# Patient Record
Sex: Male | Born: 2000 | Race: White | Hispanic: No | Marital: Single | Smoking: Never smoker
Health system: Southern US, Community
[De-identification: ages and names within clinical notes are randomized; demographics above are authoritative.]

## PROBLEM LIST (undated history)

## (undated) ENCOUNTER — Emergency Department (HOSPITAL_COMMUNITY): Discharge status: Against Medical Advice | Payer: Self-pay

## (undated) DIAGNOSIS — I499 Cardiac arrhythmia, unspecified: Secondary | ICD-10-CM

## (undated) HISTORY — PX: NO PAST SURGERIES: SHX2092

---
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
## 2018-05-13 IMAGING — MR MR CARD MORPHOLOGY WO/W CM
13 of 14 series · 39 of 40 positions shown · IV contrast (multihance)
Comparison: none

CLINICAL DATA: R/O Marfan's

EXAM:
CARDIAC MRI
TECHNIQUE: The patient was scanned on a 1.5 Tesla GE magnet. A dedicated
cardiac coil was used. Functional imaging was done using Fiesta
sequences. [DATE], and 4 chamber views were done to assess for RWMA's.
Modified Rodrigo Francisco rule using a short axis stack was used to
calculate an ejection fraction on a dedicated work station using
Circle software. The patient received 20 cc of Multihance. After 10
minutes inversion recovery sequences were used to assess for
infiltration and scar tissue.
CONTRAST:  20 cc Multihance

[Series 3: bSSFP · sagittal · 8.0mm · 1.21mm/px · 1 of 18 slices shown (1 of 7)]
[im 1/18]
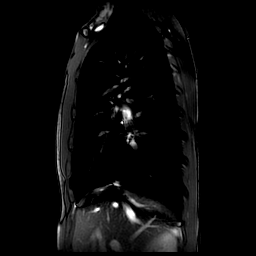

[Series 4: bSSFP · coronal · 8.0mm · 1.37mm/px · 1 of 20 slices shown (2 of 7)]
[im 1/20]
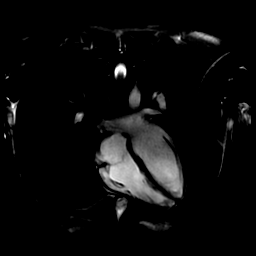

[Series 5: bSSFP · axial · 8.0mm · 1.21mm/px · 1 of 20 slices shown (3 of 7)]
[im 1/20]
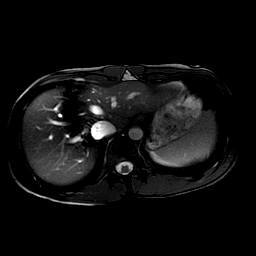

[Series 6: bSSFP · oblique · 8.0mm · 1.25mm/px · 15 of 360 slices shown (4 of 7)]
[im 1/360]
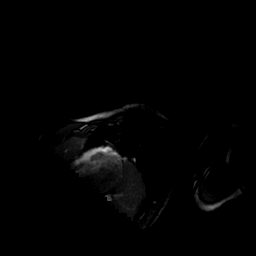
[im 26/360]
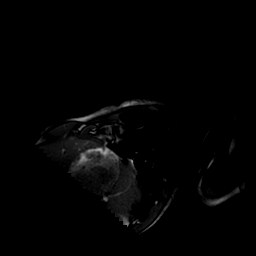
[im 52/360]
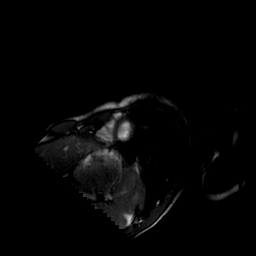
[im 77/360]
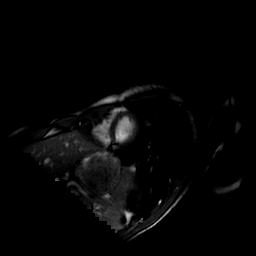
[im 103/360]
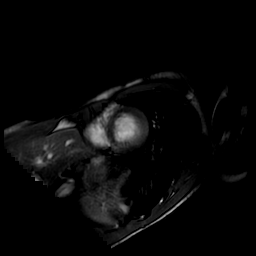
[im 129/360]
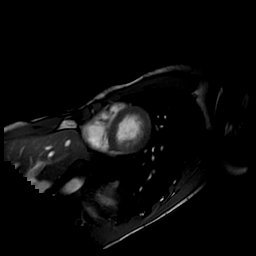
[im 154/360]
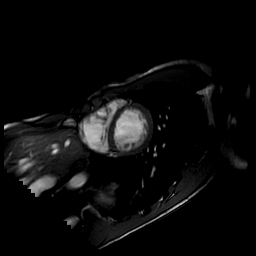
[im 180/360]
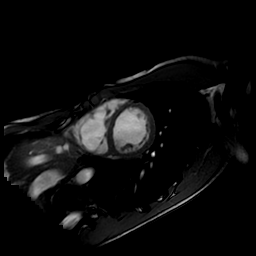
[im 206/360]
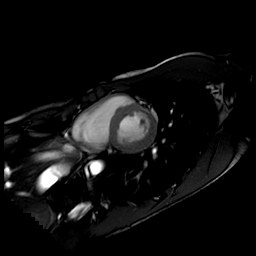
[im 231/360]
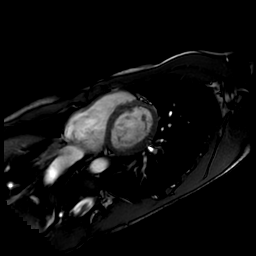
[im 257/360]
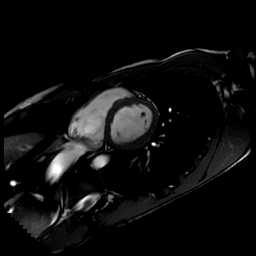
[im 283/360]
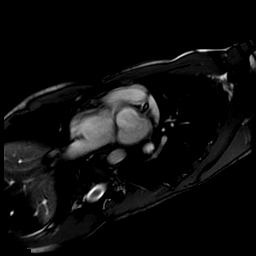
[im 308/360]
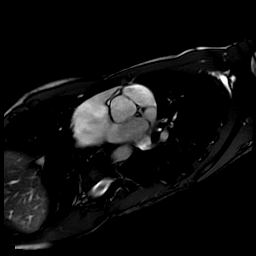
[im 334/360]
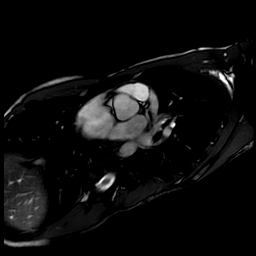
[im 360/360]
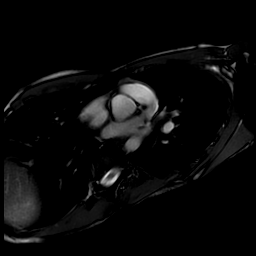

[Series 7: T1 · oblique · 6.0mm · 1.17mm/px · 1 of 3 slices shown]
[im 1/3]
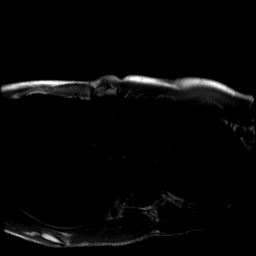

[Series 9: bSSFP · axial · 6.0mm · 1.29mm/px · z∈[-109,-74]mm · 6 of 160 slices shown (5 of 7)]
[im 1/160]
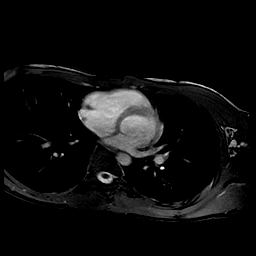
[im 32/160]
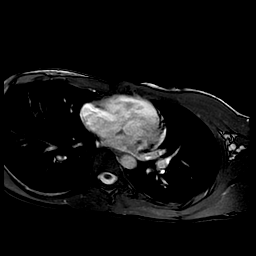
[im 64/160]
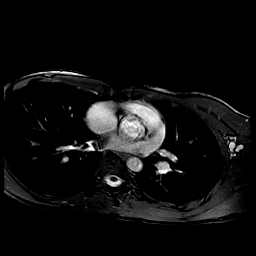
[im 96/160]
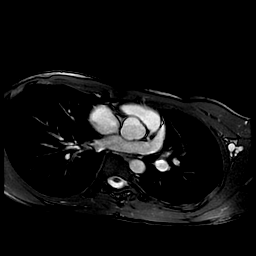
[im 128/160]
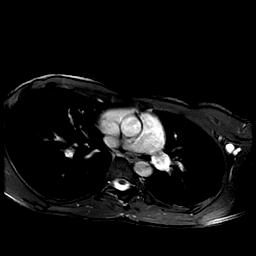
[im 160/160]
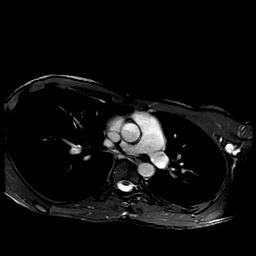

[Series 10: bSSFP · sagittal · 6.0mm · 1.29mm/px · 5 of 140 slices shown (6 of 7)]
[im 1/140]
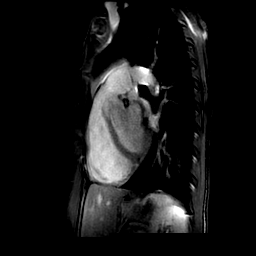
[im 35/140]
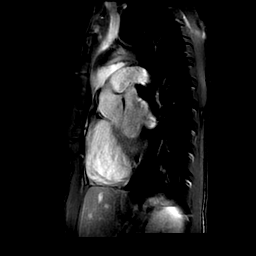
[im 70/140]
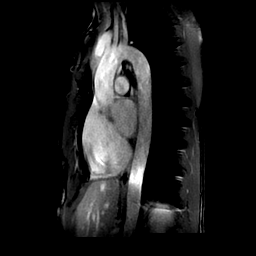
[im 105/140]
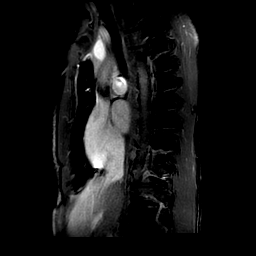
[im 140/140]
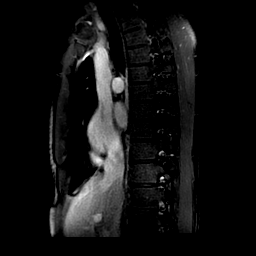

[Series 11: bSSFP · coronal · 8.0mm · 1.21mm/px · 2 of 60 slices shown (7 of 7)]
[im 1/60]
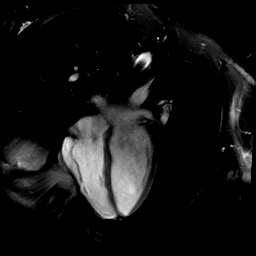
[im 60/60]
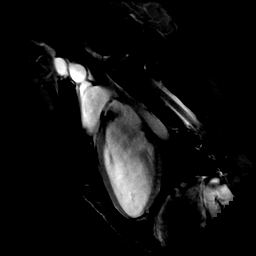

[Series 16: delayed ir prep · oblique · 8.0mm · 1.25mm/px · 1 of 15 slices shown]
[im 1/15]
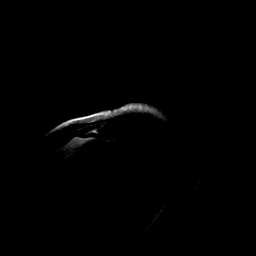

[Series 17: rad mde · coronal · 8.0mm · 1.21mm/px · 1 of 3 slices shown]
[im 1/3]
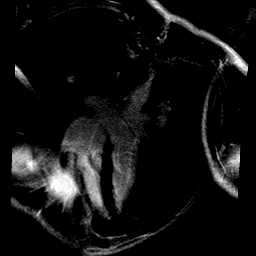

[((id)/801/1)-((id)/800/1) · sagittal · 2.0mm · 0.61mm/px · 3 of 68 slices shown]
[im 1/68]
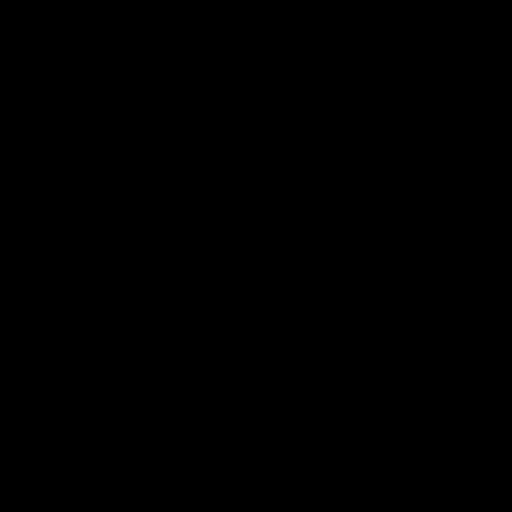
[im 34/68]
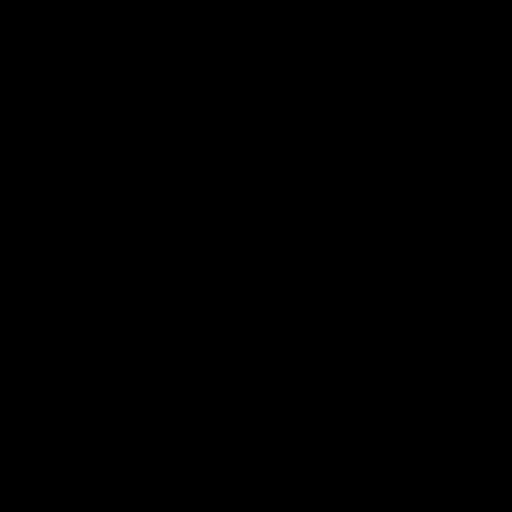
[im 68/68]
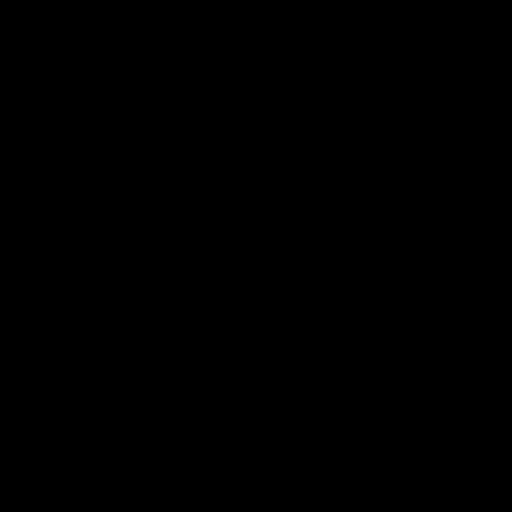

[processed images · sagittal · 2.0mm · 0.61mm/px · 1 of 14 slices shown (1 of 2)]
[im 1/14]
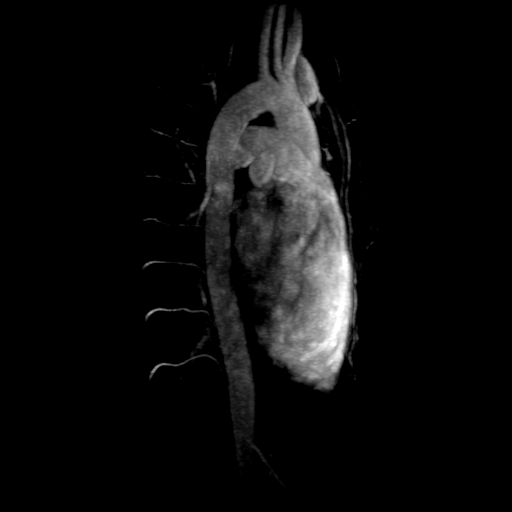

[processed images · sagittal · 2.0mm · 0.61mm/px · 1 of 8 slices shown (2 of 2)]
[im 1/8]
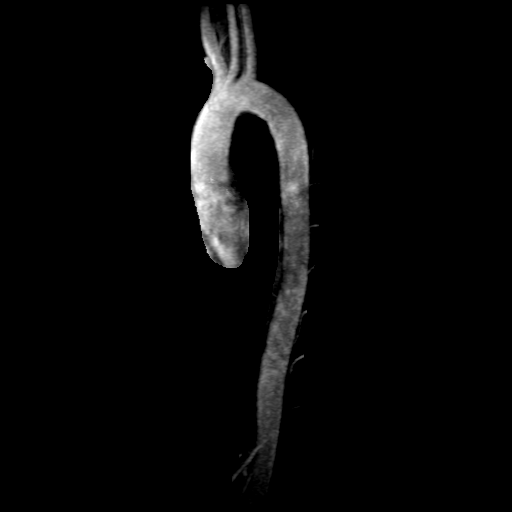

[39 of 40 positions shown; findings below may reference images not displayed]

FINDINGS: All 4 cardiac chambers were normal in size and function. There was
no ASD/VSD/PFO. There was no pericardial effusion. The quantitative
EF was 58% (EDV 149 cc ESV 63 cc SV 86 cc) The aortic valve was tri
leaflet and normal. There was no dilatation of the sinus or root.
The mitral and tricuspid valves were normal. Delayed enhancement
images showed no scar, inflammation or infarct.

Aortic MRA: No aortic root or sinus dilatation Normal arch vessels
no coarctation

Aortic Root:  28 mm

Sinus:  27 mm

Arch 19 mm

Descending Thoracic aorta:  18 mm
IMPRESSION: 1) Normal LV size and function EF 58%

2) No delayed enhancement with gadolinium

3) Normal cardiac valves

4) Normal aortic root and sinus

5) Tri leaflet aortic valve with no AS/AR

6) Normal RV

7) No pericardial effusion

Rondinele Maruyama
